# Patient Record
Sex: Female | Born: 1935 | Race: Black or African American | Hispanic: No
Health system: Southern US, Community
[De-identification: ages and names within clinical notes are randomized; demographics above are authoritative.]

## PROBLEM LIST (undated history)

## (undated) DIAGNOSIS — D649 Anemia, unspecified: Secondary | ICD-10-CM

## (undated) DIAGNOSIS — I2510 Atherosclerotic heart disease of native coronary artery without angina pectoris: Secondary | ICD-10-CM

## (undated) DIAGNOSIS — I2089 Other forms of angina pectoris: Secondary | ICD-10-CM

## (undated) DIAGNOSIS — E785 Hyperlipidemia, unspecified: Secondary | ICD-10-CM

## (undated) DIAGNOSIS — F32A Depression, unspecified: Secondary | ICD-10-CM

## (undated) DIAGNOSIS — K219 Gastro-esophageal reflux disease without esophagitis: Secondary | ICD-10-CM

## (undated) DIAGNOSIS — F329 Major depressive disorder, single episode, unspecified: Secondary | ICD-10-CM

## (undated) DIAGNOSIS — F419 Anxiety disorder, unspecified: Secondary | ICD-10-CM

## (undated) DIAGNOSIS — I208 Other forms of angina pectoris: Secondary | ICD-10-CM

## (undated) DIAGNOSIS — F29 Unspecified psychosis not due to a substance or known physiological condition: Secondary | ICD-10-CM

---

## 2007-01-14 ENCOUNTER — Ambulatory Visit: Payer: Self-pay | Admitting: Family Medicine

## 2007-06-12 ENCOUNTER — Other Ambulatory Visit: Payer: Self-pay

## 2007-06-12 ENCOUNTER — Emergency Department: Payer: Self-pay | Admitting: Emergency Medicine

## 2015-06-23 ENCOUNTER — Emergency Department: Payer: Medicare Other

## 2015-06-23 ENCOUNTER — Encounter: Payer: Self-pay | Admitting: Medical Oncology

## 2015-06-23 ENCOUNTER — Emergency Department
Admission: EM | Admit: 2015-06-23 | Discharge: 2015-06-23 | Disposition: A | Discharge status: Home / Self Care | Payer: Medicare Other | Attending: Emergency Medicine | Admitting: Emergency Medicine

## 2015-06-23 DIAGNOSIS — W06XXXA Fall from bed, initial encounter: Secondary | ICD-10-CM | POA: Diagnosis not present

## 2015-06-23 DIAGNOSIS — Z7982 Long term (current) use of aspirin: Secondary | ICD-10-CM | POA: Insufficient documentation

## 2015-06-23 DIAGNOSIS — Y92129 Unspecified place in nursing home as the place of occurrence of the external cause: Secondary | ICD-10-CM | POA: Diagnosis not present

## 2015-06-23 DIAGNOSIS — Y9389 Activity, other specified: Secondary | ICD-10-CM | POA: Insufficient documentation

## 2015-06-23 DIAGNOSIS — S0993XA Unspecified injury of face, initial encounter: Secondary | ICD-10-CM | POA: Diagnosis present

## 2015-06-23 DIAGNOSIS — Y998 Other external cause status: Secondary | ICD-10-CM | POA: Diagnosis not present

## 2015-06-23 DIAGNOSIS — Z79899 Other long term (current) drug therapy: Secondary | ICD-10-CM | POA: Insufficient documentation

## 2015-06-23 DIAGNOSIS — F0390 Unspecified dementia without behavioral disturbance: Secondary | ICD-10-CM | POA: Insufficient documentation

## 2015-06-23 DIAGNOSIS — S0081XA Abrasion of other part of head, initial encounter: Secondary | ICD-10-CM | POA: Diagnosis not present

## 2015-06-23 DIAGNOSIS — S0990XA Unspecified injury of head, initial encounter: Secondary | ICD-10-CM

## 2015-06-23 DIAGNOSIS — S0083XA Contusion of other part of head, initial encounter: Secondary | ICD-10-CM | POA: Diagnosis not present

## 2015-06-23 HISTORY — DX: Anxiety disorder, unspecified: F41.9

## 2015-06-23 HISTORY — DX: Gastro-esophageal reflux disease without esophagitis: K21.9

## 2015-06-23 HISTORY — DX: Unspecified psychosis not due to a substance or known physiological condition: F29

## 2015-06-23 HISTORY — DX: Anemia, unspecified: D64.9

## 2015-06-23 HISTORY — DX: Depression, unspecified: F32.A

## 2015-06-23 HISTORY — DX: Atherosclerotic heart disease of native coronary artery without angina pectoris: I25.10

## 2015-06-23 HISTORY — DX: Other forms of angina pectoris: I20.8

## 2015-06-23 HISTORY — DX: Hyperlipidemia, unspecified: E78.5

## 2015-06-23 HISTORY — DX: Other forms of angina pectoris: I20.89

## 2015-06-23 HISTORY — DX: Major depressive disorder, single episode, unspecified: F32.9

## 2015-06-23 NOTE — ED Notes (Signed)
Pt informed to return if any life threatening symptoms occur.  

## 2015-06-23 NOTE — ED Notes (Signed)
Report called to [facility].

## 2015-06-23 NOTE — ED Notes (Signed)
Pt from [facility] with reports that pt rolled out of bed this am, pt has noted dry blood from her nose. Pt is yelling upon assessment however per staff this is patients normal mental status.

## 2015-06-23 NOTE — Discharge Instructions (Signed)
Head Injury, Adult °You have a head injury. Headaches and throwing up (vomiting) are common after a head injury. It should be easy to wake up from sleeping. Sometimes you must stay in the hospital. Most problems happen within the first 24 hours. Side effects may occur up to 7-10 days after the injury.  °WHAT ARE THE TYPES OF HEAD INJURIES? °Head injuries can be as minor as a bump. Some head injuries can be more severe. More severe head injuries include: °· A jarring injury to the brain (concussion). °· A bruise of the brain (contusion). This mean there is bleeding in the brain that can cause swelling. °· A cracked skull (skull fracture). °· Bleeding in the brain that collects, clots, and forms a bump (hematoma). °WHEN SHOULD I GET HELP RIGHT AWAY?  °· You are confused or sleepy. °· You cannot be woken up. °· You feel sick to your stomach (nauseous) or keep throwing up (vomiting). °· Your dizziness or unsteadiness is getting worse. °· You have very bad, lasting headaches that are not helped by medicine. Take medicines only as told by your doctor. °· You cannot use your arms or legs like normal. °· You cannot walk. °· You notice changes in the black spots in the center of the colored part of your eye (pupil). °· You have clear or bloody fluid coming from your nose or ears. °· You have trouble seeing. °During the next 24 hours after the injury, you must stay with someone who can watch you. This person should get help right away (call 911 in the [place] if you start to shake and are not able to control it (have seizures), you pass out, or you are unable to wake up. °HOW CAN I PREVENT A HEAD INJURY IN THE FUTURE? °· Wear seat belts. °· Wear a helmet while bike riding and playing sports like football. °· Stay away from dangerous activities around the house. °WHEN CAN I RETURN TO NORMAL ACTIVITIES AND ATHLETICS? °See your doctor before doing these activities. You should not do normal activities or play contact sports until 1  week after the following symptoms have stopped: °· Headache that does not go away. °· Dizziness. °· Poor attention. °· Confusion. °· Memory problems. °· Sickness to your stomach or throwing up. °· Tiredness. °· Fussiness. °· Bothered by bright lights or loud noises. °· Anxiousness or depression. °· Restless sleep. °MAKE SURE YOU:  °· Understand these instructions. °· Will watch your condition. °· Will get help right away if you are not doing well or get worse. °  °This information is not intended to replace advice given to you by your health care provider. Make sure you discuss any questions you have with your health care provider. °  °Document Released: 07/18/2008 Document Revised: 08/26/2014 Document Reviewed: 04/12/2013 °Elsevier Interactive Patient Education ©2016 [employer] ° °Please return immediately if condition worsens. Please contact her primary physician or the physician you were given for referral. If you have any specialist physicians involved in her treatment and plan please also contact them. Thank you for using [facility]. ° °

## 2015-06-23 NOTE — ED Provider Notes (Signed)
Time Seen: Approximately 0 800 I have reviewed the triage notes  Chief Complaint: Fall   History of Present Illness: Selena Harris is a 81 y.o. female who fell out of bed this morning was found awake. Patient has normal altered mental status with dementia. Patient yells out constantly with any kind of stimulation which existed prior to her fall. Patient herself is not able to offer any history or review of systems. There does not appear to be any signs of loss of consciousness per the record. She has some obvious facial injuries but no other abnormalities noted prior to arrival   Past Medical History  Diagnosis Date  . Psychosis   . Anemia   . Anxiety   . GERD (gastroesophageal reflux disease)   . Angina at rest [facility](HCC)   . Hyperlipemia   . CAD (coronary artery disease)   . Depression     There are no active problems to display for this patient.   No past surgical history on file.  No past surgical history on file.  Current Outpatient Rx  Name  Route  Sig  Dispense  Refill  . acetaminophen (TYLENOL) 325 MG tablet   Oral   Take 325-650 mg by mouth every 4 (four) hours as needed for mild pain, moderate pain, fever or headache.         Marland Kitchen. aspirin 81 MG chewable tablet   Oral   Chew 81 mg by mouth daily.         . clonazePAM (KLONOPIN) 0.5 MG tablet   Oral   Take 0.5 mg by mouth 2 (two) times daily.         . clonazePAM (KLONOPIN) 0.5 MG tablet   Oral   Take 0.25 mg by mouth 2 (two) times daily as needed for anxiety.         . docusate (COLACE) 50 MG/5ML liquid   Oral   Take 100 mg by mouth daily.         . ferrous sulfate 220 (44 FE) MG/5ML solution   Oral   Take 330 mg by mouth daily.         . hydroxypropyl methylcellulose / hypromellose (ISOPTO TEARS / GONIOVISC) 2.5 % ophthalmic solution   Both Eyes   Place 1 drop into both eyes 2 (two) times daily as needed for dry eyes.         . nitroGLYCERIN (NITRODUR - DOSED IN MG/24 HR) 0.1 mg/hr  patch   Transdermal   Place 0.1 mg onto the skin every morning.         Marland Kitchen. QUEtiapine (SEROQUEL) 100 MG tablet   Oral   Take 100 mg by mouth at bedtime.         Marland Kitchen. QUEtiapine (SEROQUEL) 25 MG tablet   Oral   Take 25 mg by mouth every morning.         . ranitidine (ZANTAC) 150 MG tablet   Oral   Take 150 mg by mouth 2 (two) times daily.         . traMADol (ULTRAM) 50 MG tablet   Oral   Take 50 mg by mouth every 12 (twelve) hours as needed for moderate pain or severe pain.         Marland Kitchen. zolpidem (AMBIEN) 5 MG tablet   Oral   Take 5 mg by mouth at bedtime as needed for sleep.           Allergies:  Review of patient's  allergies indicates no known allergies.  Family History: No family history on file.  Social History: Social History  Substance Use Topics  . Smoking status: Unknown If Ever Smoked  . Smokeless tobacco: None  . Alcohol Use: No     Review of Systems:   10 point review of systems was performed and was otherwise negative: Review of systems acquired from the record with patient's history of dementia Constitutional: No fever Eyes: No visual disturbances ENT: No sore throat, ear pain Cardiac: No chest pain Respiratory: No shortness of breath, wheezing, or stridor Abdomen: No abdominal pain, no vomiting, No diarrhea Endocrine: No weight loss, No night sweats Extremities: No peripheral edema, cyanosis Skin: No rashes, easy bruising Neurologic: No focal weakness, trouble with speech or swollowing Urologic: No dysuria, Hematuria, or urinary frequency   Physical Exam:  ED Triage Vitals  Enc Vitals Group     BP 06/23/15 0813 160/74 mmHg     Pulse Rate 06/23/15 0813 68     Resp 06/23/15 0813 18     Temp 06/23/15 0813 96.9 F (36.1 C)     Temp Source 06/23/15 0813 Axillary     SpO2 06/23/15 0813 99 %     Weight 06/23/15 0813 96 lb (43.545 kg)     Height --      Head Cir --      Peak Flow --      Pain Score --      Pain Loc --      Pain Edu?  --      Excl. in GC? --     General: Awake , Alert , and Oriented times 1. Response to any stimulus. Nonverbal. Does not follow commands. Mild contracture Head: Normal cephalic mild contusion and abrasions frontal region with some old blood around both nares no active bleeding. No crepitus or step-off noted. Septum is midline. No unstable mid face with a edentulous across the upper palate. Dentition of the lower palate appears stable Eyes: Pupils equal , round, reactive to light Nose/Throat: No nasal drainage, patent upper airway without erythema or exudate.  Neck: No obvious posterior crepitus or step-off noted  Lungs: Clear to ascultation without wheezes , rhonchi, or rales Heart: Regular rate, regular rhythm without murmurs , gallops , or rubs Abdomen: Soft, non tender without rebound, guarding , or rigidity; bowel sounds positive and symmetric in all 4 quadrants. No organomegaly .        Extremities: 2 plus symmetric pulses. No edema, clubbing or cyanosis Neurologic: Motor symmetric without deficits, sensory intact Babinski downgoing bilaterally and retracts to stimuli Skin: warm, dry, no rashes     Radiology: EXAM: CT HEAD WITHOUT CONTRAST  CT MAXILLOFACIAL WITHOUT CONTRAST  CT CERVICAL SPINE WITHOUT CONTRAST  TECHNIQUE: Multidetector CT imaging of the head, cervical spine, and maxillofacial structures were performed using the standard protocol without intravenous contrast. Multiplanar CT image reconstructions of the cervical spine and maxillofacial structures were also generated.  COMPARISON: None.  FINDINGS: CT HEAD FINDINGS  Diffuse cortical atrophy and advanced periventricular small vessel disease. The brain demonstrates no evidence of hemorrhage, infarction, edema, mass effect, extra-axial fluid collection, hydrocephalus or mass lesion. The skull is unremarkable.  CT MAXILLOFACIAL FINDINGS  Soft tissue swelling of the bridge of the nose noted with a  small amount of subcutaneous air present. This presumably relates to acute soft tissue injury. No underlying bony fracture is identified. The nasal septum is mildly deviated to the left. Paranasal sinuses are normally aerated with  a tiny mucous retention cyst noted in the medial right maxillary antrum. The temporomandibular joints show normal alignment. Changes seen related to prior scleral buckle placement bilaterally. The orbits are symmetric and show no evidence of injury. Visualized airway is normally patent. No incidental mass lesions identified.  CT CERVICAL SPINE FINDINGS  The cervical spine shows normal alignment. There is no evidence of acute fracture or subluxation. No soft tissue swelling or hematoma is identified. There are moderate spondylosis noted of the cervical spine, most prominently at the C4-5, C5-6 and C6-7 levels. No bony or soft tissue lesions are seen. The visualized airway is normally patent.  IMPRESSION: 1. Diffuse atrophy and advanced small vessel disease. No acute findings by head CT. 2. Soft tissue injury of the nose with soft tissue swelling and a small amount of subcutaneous air present. No evidence of underlying acute maxillofacial fractures. 3. No evidence of acute cervical spine injury. Moderate cervical spondylosis present.       I personally reviewed the radiologic studies    ED Course:  Patient's stay here was uneventful and did not appear to have any significant injuries from a non-syncopal fall. Patient be transported back to the nursing facility via BLS transport. She is otherwise hemodynamically stable and appears to be at her baseline mentally.   Assessment:  Acute closed head injury Facial contusion Controlled epistaxis     Plan: Patient was advised to return immediately if condition worsens. Patient was advised to follow up with her primary care physician or other specialized physicians involved and in their current  assessment.             Jennye Moccasin, MD 06/23/15 [phone]

## 2015-06-23 NOTE — ED Notes (Signed)
EMS called for transport [facility] @ 1002[facility], copy of ED Chart to go back with patient

## 2015-07-04 ENCOUNTER — Ambulatory Visit (INDEPENDENT_AMBULATORY_CARE_PROVIDER_SITE_OTHER): Payer: Medicare Other | Admitting: Family Medicine

## 2015-07-04 DIAGNOSIS — R627 Adult failure to thrive: Secondary | ICD-10-CM

## 2015-07-04 NOTE — Patient Instructions (Signed)
Seen at nursing home

## 2015-07-04 NOTE — Progress Notes (Signed)
Pt was seen at nursing home for evaluation

## 2015-09-04 ENCOUNTER — Ambulatory Visit (INDEPENDENT_AMBULATORY_CARE_PROVIDER_SITE_OTHER): Payer: Medicare Other | Admitting: Family Medicine

## 2015-09-04 DIAGNOSIS — Z593 Problems related to living in residential institution: Secondary | ICD-10-CM

## 2015-09-04 NOTE — Progress Notes (Signed)
Name: Selena Harris   MRN: [id number]    DOB: 09/04/1914   Date:09/04/2015       Progress Note  Subjective  Chief Complaint  No chief complaint on file.   HPI Comments: Patient seen for 60 day evaluation.   No problem-specific assessment & plan notes found for this encounter.   Past Medical History  Diagnosis Date  . Psychosis   . Anemia   . Anxiety   . GERD (gastroesophageal reflux disease)   . Angina at rest [facility](HCC)   . Hyperlipemia   . CAD (coronary artery disease)   . Depression     No past surgical history on file.  No family history on file.  Social History   Social History  . Marital Status: Widowed    Spouse Name: N/A  . Number of Children: N/A  . Years of Education: N/A   Occupational History  . Not on file.   Social History Main Topics  . Smoking status: Unknown If Ever Smoked  . Smokeless tobacco: Not on file  . Alcohol Use: No  . Drug Use: Not on file  . Sexual Activity: Not on file   Other Topics Concern  . Not on file   Social History Narrative  . No narrative on file    No Known Allergies   Review of Systems  Unable to perform ROS: dementia     Objective  There were no vitals filed for this visit.  Physical Exam  Constitutional: She is well-developed, well-nourished, and in no distress. No distress.  HENT:  Head: Normocephalic and atraumatic.  Right Ear: External ear normal.  Left Ear: External ear normal.  Nose: Nose normal.  Eyes: Conjunctivae are normal. Pupils are equal, round, and reactive to light. Right eye exhibits no discharge. Left eye exhibits no discharge.  Neck: Normal range of motion. Neck supple. No JVD present. No thyromegaly present.  Cardiovascular: Normal rate, regular rhythm, normal heart sounds and intact distal pulses.  Exam reveals no gallop and no friction rub.   No murmur heard. Pulmonary/Chest: Effort normal and breath sounds normal. She has no wheezes. She has no rales.  Abdominal: Soft. Bowel  sounds are normal. She exhibits no mass. There is no tenderness. There is no guarding.  Musculoskeletal: Normal range of motion. She exhibits no edema.  Lymphadenopathy:    She has no cervical adenopathy.  Neurological: She is alert.  Skin: Skin is warm and dry. She is not diaphoretic. No erythema.  Psychiatric: Mood and affect normal.  Nursing note and vitals reviewed.     Assessment & Plan  Problem List Items Addressed This Visit    None    Visit Diagnoses    Nursing home resident    -  Primary    cont all meds at facility         Dr. Elizabeth Sauereanna Omari Koslosky [facility]  09/04/2015

## 2015-11-06 ENCOUNTER — Ambulatory Visit: Payer: Medicare Other | Admitting: Family Medicine

## 2015-11-06 DIAGNOSIS — Z593 Problems related to living in residential institution: Secondary | ICD-10-CM

## 2015-11-06 NOTE — Progress Notes (Signed)
Pt was seen in nursing home on 11/04/2015

## 2016-04-18 ENCOUNTER — Emergency Department
Admission: EM | Admit: 2016-04-18 | Discharge: 2016-04-19 | Disposition: E | Payer: Medicare Other | Attending: Emergency Medicine | Admitting: Emergency Medicine

## 2016-04-18 DIAGNOSIS — E162 Hypoglycemia, unspecified: Secondary | ICD-10-CM | POA: Insufficient documentation

## 2016-04-18 DIAGNOSIS — I2510 Atherosclerotic heart disease of native coronary artery without angina pectoris: Secondary | ICD-10-CM | POA: Diagnosis not present

## 2016-04-18 DIAGNOSIS — Z7982 Long term (current) use of aspirin: Secondary | ICD-10-CM | POA: Diagnosis not present

## 2016-04-18 DIAGNOSIS — I959 Hypotension, unspecified: Secondary | ICD-10-CM | POA: Insufficient documentation

## 2016-04-18 DIAGNOSIS — I469 Cardiac arrest, cause unspecified: Secondary | ICD-10-CM | POA: Diagnosis not present

## 2016-04-18 DIAGNOSIS — Z79899 Other long term (current) drug therapy: Secondary | ICD-10-CM | POA: Diagnosis not present

## 2016-04-18 DIAGNOSIS — R4020 Unspecified coma: Secondary | ICD-10-CM | POA: Diagnosis present

## 2016-04-18 LAB — GLUCOSE, CAPILLARY: Glucose-Capillary: 44 mg/dL — CL (ref 65–99)

## 2016-04-18 MED ORDER — DEXTROSE 50 % IV SOLN
INTRAVENOUS | Status: DC
Start: 2016-04-18 — End: 2016-04-19
  Filled 2016-04-18: qty 50

## 2016-04-18 MED ORDER — DEXTROSE 50 % IV SOLN
25.0000 g | Freq: Once | INTRAVENOUS | Status: AC
  Administered 2016-04-18: 25 g via INTRAVENOUS

## 2016-04-19 DIAGNOSIS — 419620001 Death: Secondary | SNOMED CT | POA: Diagnosis not present

## 2016-04-19 NOTE — ED Notes (Signed)
Unable to obtain pulse ox. Dr Sharma CovertNorman and family at bedside.

## 2016-04-19 NOTE — ED Triage Notes (Signed)
Per EMS report, patient is a resident at [facility] and has gradually become unresponsive throughout the day. Patient was at baseline alertness when awakened this morning. Patient is a DNR. Per EMS report, patient had sats in the 70's upon their arrival and was hypotensive at 68/25. Rhythym is afib.

## 2016-04-19 NOTE — ED Notes (Signed)
Family has left.

## 2016-04-19 NOTE — ED Notes (Signed)
Patient pronounced by Dr. Sharma CovertNorman.

## 2016-04-19 NOTE — ED Notes (Signed)
Family at bedside. 

## 2016-04-19 NOTE — ED Notes (Signed)
Report called to [facility]von[facility] at [facility].

## 2016-04-19 NOTE — ED Provider Notes (Signed)
[facility] Emergency Department Provider Note  ____________________________________________  Time seen: Approximately 5:29 PM  I have reviewed the triage vital signs and the nursing notes.   HISTORY  Chief Complaint Weakness    HPI Selena Harris is a 82 y.o. female who is brought to the emergency department for unresponsiveness. The patient was in a nursing home, and had become unresponsive. The patient is unable to give any history, but there is a report that she was able to give verbal answers this morning. No history of recent illness that was reported. Blood sugar at the nursing home was 65. On arrival, EMS noted blood pressure in the 60s, tachycardia and tachypnea with out the ability to get an oxygen saturation.   Past Medical History:  Diagnosis Date  . Anemia   . Angina at rest [facility])   . Anxiety   . CAD (coronary artery disease)   . Depression   . GERD (gastroesophageal reflux disease)   . Hyperlipemia   . Psychosis     There are no active problems to display for this patient.   No past surgical history on file.  Current Outpatient Rx  . Order #: [id number] Class: Historical Med  . Order #: [id number] Class: Historical Med  . Order #: [id number] Class: Historical Med  . Order #: [id number] Class: Historical Med  . Order #: [id number] Class: Historical Med  . Order #: [id number] Class: Historical Med  . Order #: [id number] Class: Historical Med  . Order #: [id number] Class: Historical Med  . Order #: [id number] Class: Historical Med  . Order #: [id number] Class: Historical Med  . Order #: [id number] Class: Historical Med  . Order #: [id number] Class: Historical Med  . Order #: [id number] Class: Historical Med    Allergies Review of patient's allergies indicates no known allergies.  No family history on file.  Social History Social History  Substance Use Topics  . Smoking status: Unknown If Ever Smoked  . Smokeless tobacco: Not on file  . Alcohol  use No    Review of Systems Unable to obtain  ____________________________________________   PHYSICAL EXAM:  VITAL SIGNS: ED Triage Vitals  Enc Vitals Group     BP      Pulse      Resp      Temp      Temp src      SpO2      Weight      Height      Head Circumference      Peak Flow      Pain Score      Pain Loc      Pain Edu?      Excl. in GC?     Constitutional: The patient is unresponsive and has some minimal movement with painful stimulus such as blood sugar testing, but does not respond to sternal rub. She does clamp down on a Yankauer when I test her for gag. Blood sugar 44. Eyes: Conjunctivae are dry.  No extraocular movements. Pupils are 2 mm and unresponsive bilaterally. No scleral icterus. Head: Atraumatic. Nose: No congestion/rhinnorhea. Mouth/Throat: Mucous membranes are very dry.  Neck: No stridor.  Supple.  No obvious meningismus. Cardiovascular: Initially the patient is tachycardic, and becomes progressively bradycardic during my examination. Respiratory: Tachycardia with accessory muscle use and mild retractions.   Lungs CTAB.  No wheezes, rales or ronchi. Gastrointestinal: Soft, nontender and mildly distended.  No guarding or rebound.  No peritoneal signs. Musculoskeletal: No LE edema.  Neurologic: Unresponsive. Does not move her extremities except  for minimal movement with painful stimulus. Skin:  Skin is warm, dry and intact. No rash noted. Psychiatric: Unable to assess ____________________________________________   LABS (all labs ordered are listed, but only abnormal results are displayed)  Labs Reviewed  GLUCOSE, CAPILLARY - Abnormal; Notable for the following:       Result Value   Glucose-Capillary 44 (*)    All other components within normal limits   ____________________________________________  EKG  ED ECG REPORT I, Rockne MenghiniNorman, Anne-Caroline, the attending physician, personally viewed and interpreted this ECG.   Date: 24-Aug-2015  EKG  Time: 1725  Rate: 136  Rhythm: Irregular narrow complex tachycardia Axis: Normal  Intervals:Prolonged QTC  ST&T Change: Poor baseline tracing no obvious ST elevation. T-wave inversions in aVL.  ____________________________________________  RADIOLOGY  No results found.  ____________________________________________   PROCEDURES  Procedure(s) performed: None  Procedures  Critical Care performed: Yes, see critical care note(s) ____________________________________________   INITIAL IMPRESSION / ASSESSMENT AND PLAN / ED COURSE  Pertinent labs & imaging results that were available during my care of the patient were reviewed by me and considered in my medical decision making (see chart for details).  82 y.o. female with a known DNR/DNI with acute altered mental status today. On arrival to the emergency department, the patient has multiple critical abnormalities. She is hypotensive, tachycardic, and hypoglycemic. She is unresponsive. She was treated with an amp of D50 what was awaiting the family to come into the room.  In the room, I spoke with the patient's brother, as well as her sister [name] her POA, and her son [name] over the phone who is also a POA. Everybody in the family agreed that the patient would not want any further intervention, diagnostic evaluation or treatment. As we were speaking, the patient developed profound bradycardia into the 20s and lost pulses. She had several agonal breaths, and stopped breathing. The time of death is 17:43.    CRITICAL CARE Performed by: Rockne MenghiniNorman, Anne-Caroline   Total critical care time: 35 minutes  Critical care time was exclusive of separately billable procedures and treating other patients.  Critical care was necessary to treat or prevent imminent or life-threatening deterioration.  Critical care was time spent personally by me on the following activities: development of treatment plan with patient and/or surrogate as well as  nursing, discussions with consultants, evaluation of patient's response to treatment, examination of patient, obtaining history from patient or surrogate, ordering and performing treatments and interventions, ordering and review of laboratory studies, ordering and review of radiographic studies, pulse oximetry and re-evaluation of patient's condition.   ____________________________________________  FINAL CLINICAL IMPRESSION(S) / ED DIAGNOSES  Final diagnoses:  Death  Hypotension, unspecified hypotension type  PEA (Pulseless electrical activity) (HCC)  Hypoglycemia    Clinical Course      NEW MEDICATIONS STARTED DURING THIS VISIT:  New Prescriptions   No medications on file      Rockne MenghiniAnne-Caroline Bishoy Cupp, MD 04-07-2016 1756

## 2016-04-19 NOTE — ED Notes (Addendum)
No respirations or pulse noted.  Dr. Sharma CovertNorman and family are at bedside.

## 2016-04-19 NOTE — ED Notes (Signed)
Selena Harris Patient representative and chaplain called.

## 2016-04-19 DEATH — deceased

## 2016-04-30 ENCOUNTER — Other Ambulatory Visit: Payer: Self-pay

## 2016-08-03 IMAGING — CT CT MAXILLOFACIAL W/O CM
6 of 11 series · 15 of 47 positions shown, 16 images · non-contrast
Comparison: None.

CLINICAL DATA: Fall out of bed with bloody nose. Initial encounter.

EXAM:
CT HEAD WITHOUT CONTRAST
CT MAXILLOFACIAL WITHOUT CONTRAST
CT CERVICAL SPINE WITHOUT CONTRAST
TECHNIQUE: Multidetector CT imaging of the head, cervical spine, and
maxillofacial structures were performed using the standard protocol
without intravenous contrast. Multiplanar CT image reconstructions
of the cervical spine and maxillofacial structures were also
generated.

[Series 4: max soft · axial · 0.31mm/px · z∈[-220,-158]mm · 3 of 78 slices shown]
[im 16/78  brain]
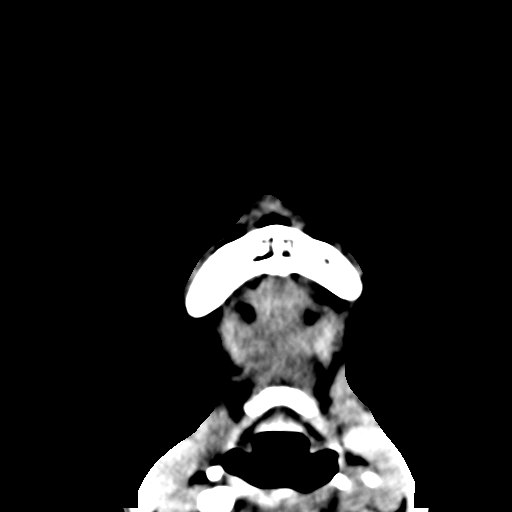
[im 31/78  brain]
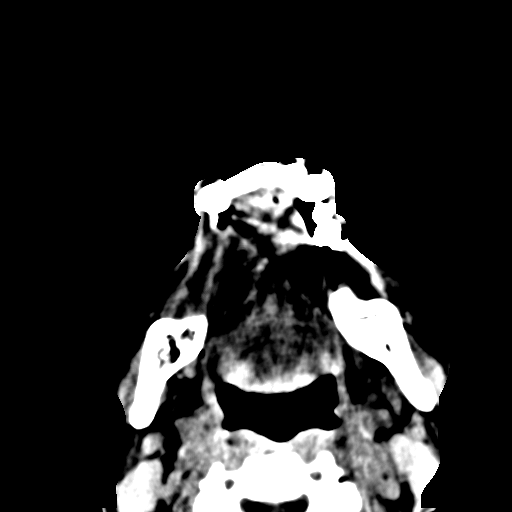
[im 47/78  brain]
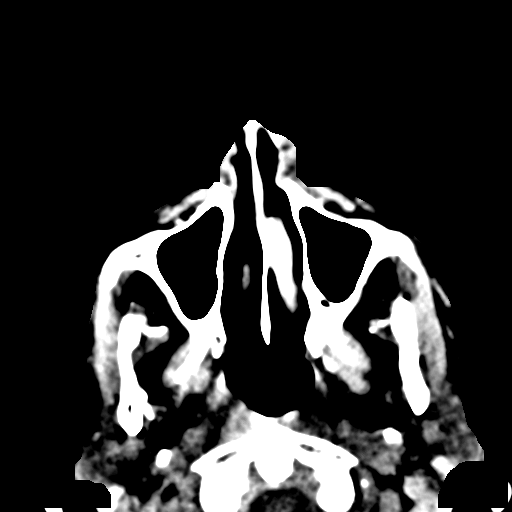

[Series 11: head bone recon · axial · 0.39mm/px · z∈[-101,-38]mm · 3 of 66 slices shown, 4 images]
[im 17/66  brain]
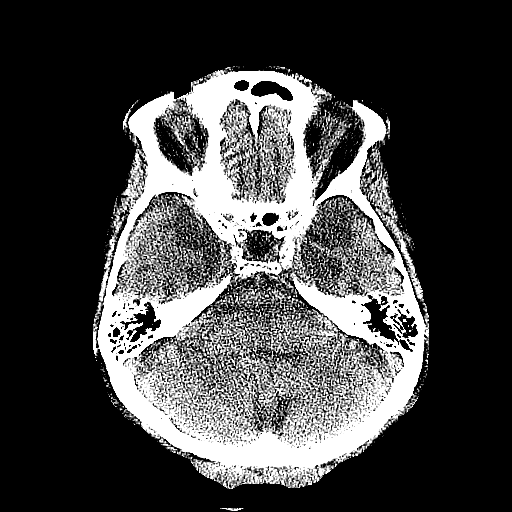
[im 17/66  bone]
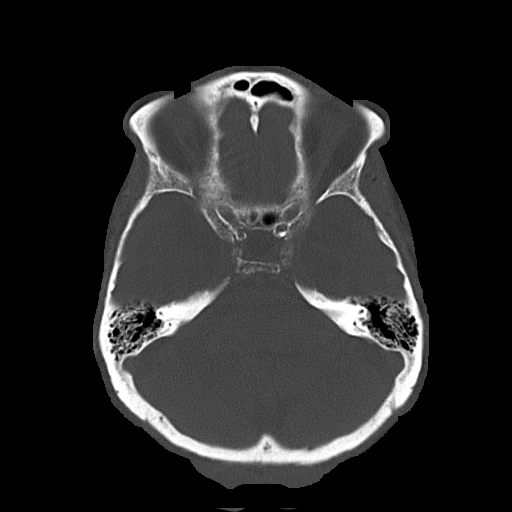
[im 33/66  bone]
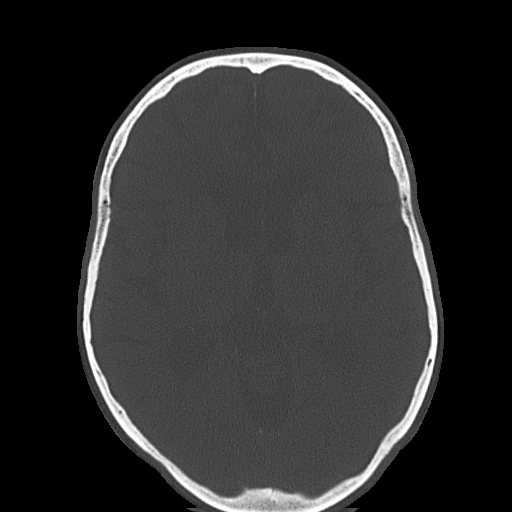
[im 49/66  bone]
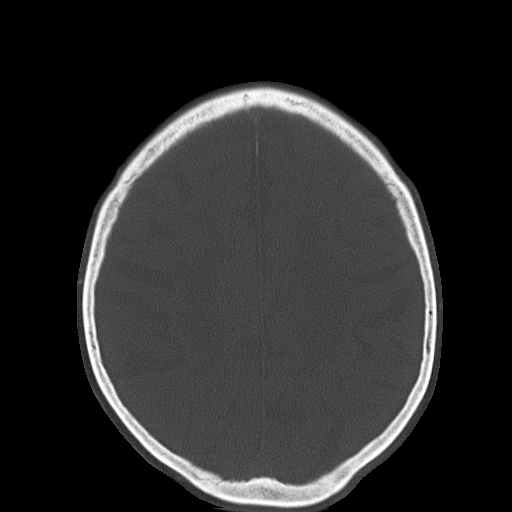

[Series 16: sagittal bone · sagittal · 0.18mm/px · 1 of 47 slices shown]
[im 24/47  bone]
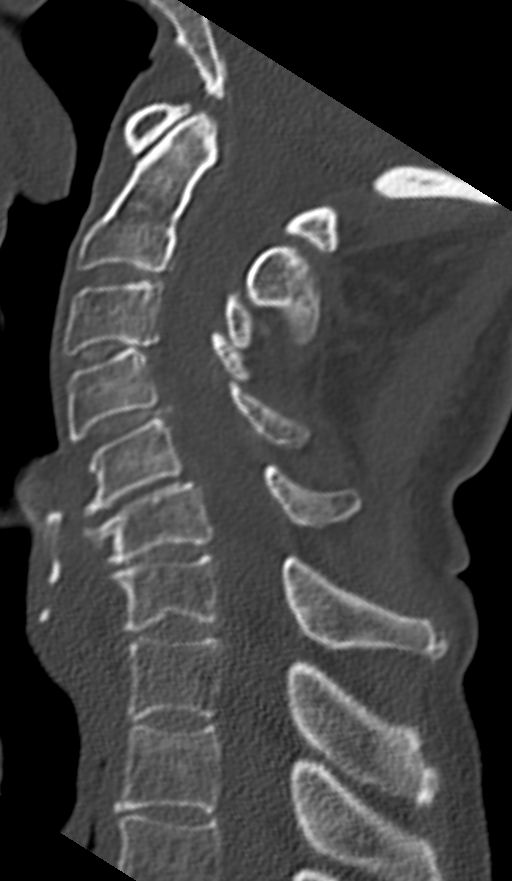

[Series 17: coronal bone · coronal · 0.21mm/px · 2 of 44 slices shown]
[im 15/44  bone]
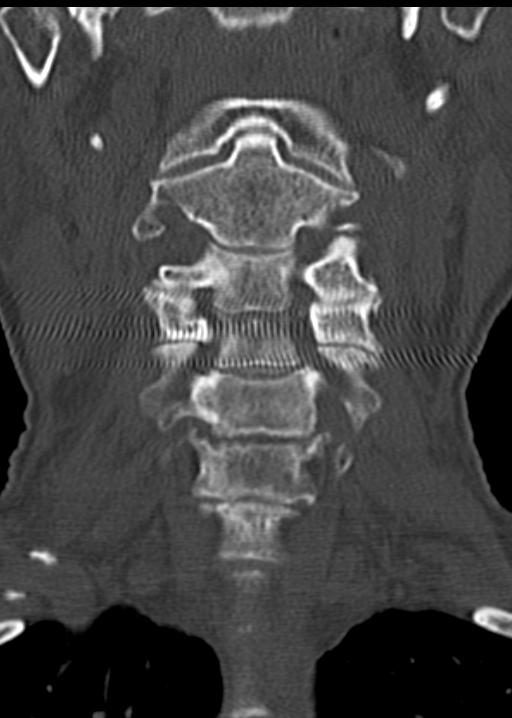
[im 29/44  bone]
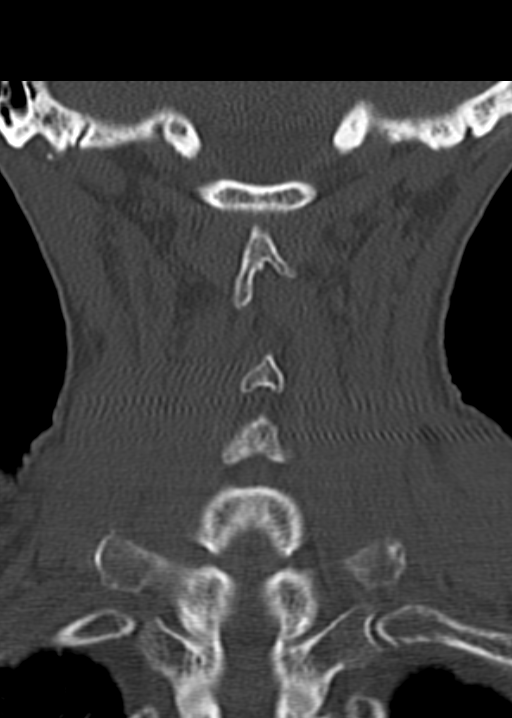

[Series 18: axial · axial · 0.20mm/px · z∈[-256,-192]mm · 3 of 74 slices shown]
[im 19/74  bone]
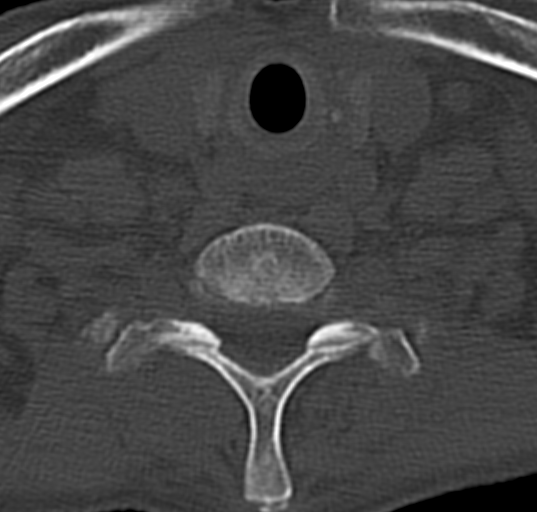
[im 37/74  bone]
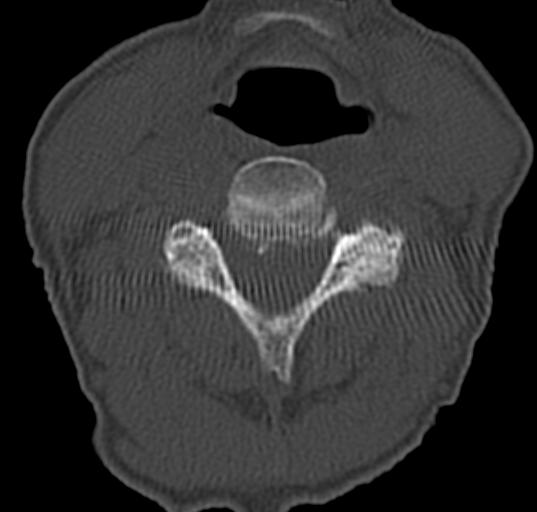
[im 55/74  bone]
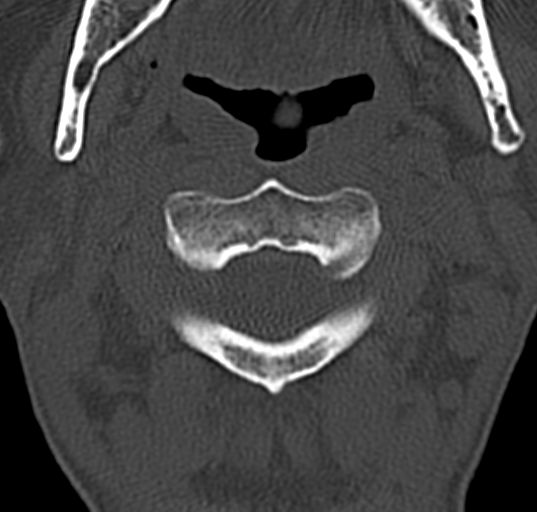

[Series 19: axial 2 · axial · 0.21mm/px · z∈[-259,-196]mm · 3 of 74 slices shown]
[im 19/74  bone]
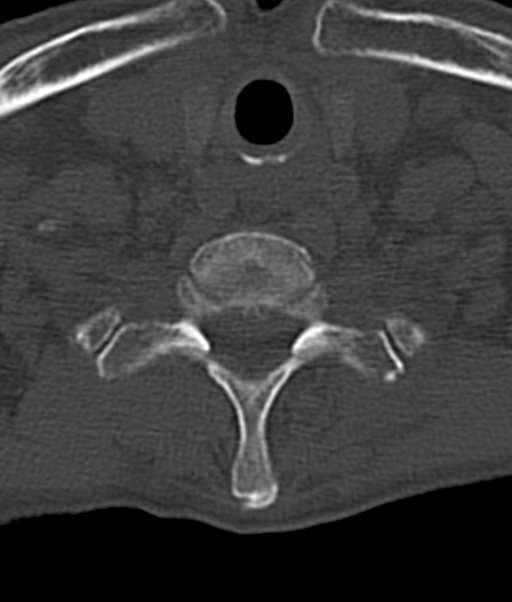
[im 37/74  bone]
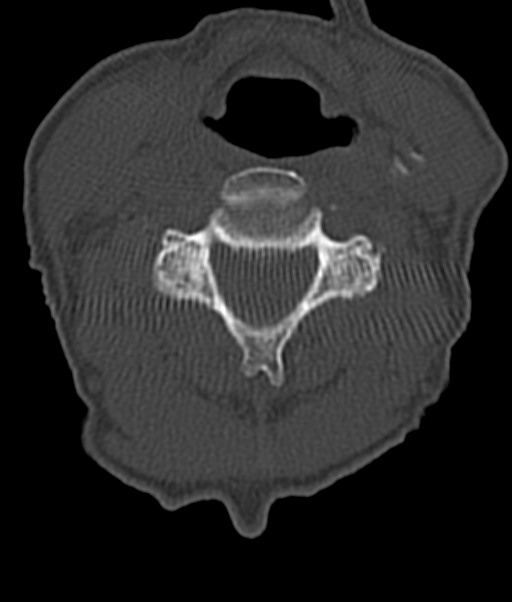
[im 55/74  bone]
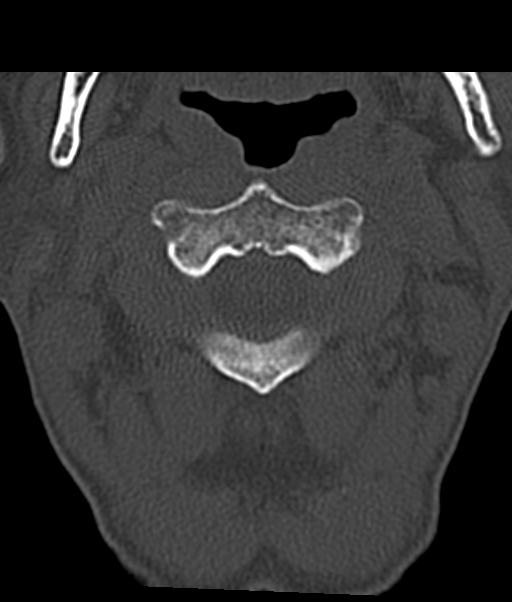

[15 of 47 positions shown; findings below may reference images not displayed]

FINDINGS: CT HEAD FINDINGS

Diffuse cortical atrophy and advanced periventricular small vessel
disease. The brain demonstrates no evidence of hemorrhage,
infarction, edema, mass effect, extra-axial fluid collection,
hydrocephalus or mass lesion. The skull is unremarkable.

CT MAXILLOFACIAL FINDINGS

Soft tissue swelling of the bridge of the nose noted with a small
amount of subcutaneous air present. This presumably relates to acute
soft tissue injury. No underlying bony fracture is identified. The
nasal septum is mildly deviated to the left. Paranasal sinuses are
normally aerated with a tiny mucous retention cyst noted in the
medial right maxillary antrum. The temporomandibular joints show
normal alignment. Changes seen related to prior scleral buckle
placement bilaterally. The orbits are symmetric and show no evidence
of injury. Visualized airway is normally patent. No incidental mass
lesions identified.

CT CERVICAL SPINE FINDINGS

The cervical spine shows normal alignment. There is no evidence of
acute fracture or subluxation. No soft tissue swelling or hematoma
is identified. There are moderate spondylosis noted of the cervical
spine, most prominently at the C4-5, C5-6 and C6-7 levels. No bony
or soft tissue lesions are seen. The visualized airway is normally
patent.
IMPRESSION: 1. Diffuse atrophy and advanced small vessel disease. No acute
findings by head CT.
2. Soft tissue injury of the nose with soft tissue swelling and a
small amount of subcutaneous air present. No evidence of underlying
acute maxillofacial fractures.
3. No evidence of acute cervical spine injury. Moderate cervical
spondylosis present.
